# Patient Record
Sex: Female | Born: 1990 | Race: White | Hispanic: No | Marital: Single | Smoking: Current every day smoker
Health system: Southern US, Community
[De-identification: ages and names within clinical notes are randomized; demographics above are authoritative.]

---

## 2017-07-12 ENCOUNTER — Other Ambulatory Visit: Payer: Self-pay

## 2017-07-12 ENCOUNTER — Ambulatory Visit (INDEPENDENT_AMBULATORY_CARE_PROVIDER_SITE_OTHER)

## 2017-07-12 ENCOUNTER — Ambulatory Visit
Admission: EM | Admit: 2017-07-12 | Discharge: 2017-07-12 | Disposition: A | Discharge status: Home / Self Care | Attending: Family Medicine | Admitting: Family Medicine

## 2017-07-12 DIAGNOSIS — W19XXXA Unspecified fall, initial encounter: Secondary | ICD-10-CM | POA: Diagnosis not present

## 2017-07-12 DIAGNOSIS — M25531 Pain in right wrist: Secondary | ICD-10-CM

## 2017-07-12 MED ORDER — MELOXICAM 15 MG PO TABS: 15.0000 mg | ORAL_TABLET | Freq: Every day | ORAL | 0 refills | Status: AC | PRN

## 2017-07-12 NOTE — Discharge Instructions (Signed)
Xray negative.  Rest, ice, elevation.  Use the meloxicam as needed.  If persists, please follow up with your PCP.  Take care  Dr. Adriana Simasook

## 2017-07-12 NOTE — ED Provider Notes (Signed)
[facility]    CSN: [id number] Arrival date & time: 07/12/17  1826  History   Chief Complaint Chief Complaint  Patient presents with  . Fall  . Wrist Pain    Right   HPI  27 year old female presents with right wrist pain.  Patient states that earlier today she was walking her dog.  This was around 12:30 PM.  She states that she somehow slipped and fell forward on an outstretched hand.  She states that she injured her right wrist.  Since that time she has had significant pain, 7.5/10 in severity.  Pain is located on the radial side of the wrist.  She has taken Advil and has been using a brace without resolution.  No reports of swelling.  She states that the area appears red to her.  Decreased range of motion secondary to pain.  Worse with range of motion.  No relieving factors.  No other associated symptoms.  No other complaints at this time.  PMH - Tobacco abuse.  Surgical Hx - No past surgeries.  Home Medications    Prior to Admission medications   Medication Sig Start Date End Date Taking? Authorizing Provider  meloxicam (MOBIC) 15 MG tablet Take 1 tablet (15 mg total) by mouth daily as needed for pain. 07/12/17   Tommie Samsook, Glorianna Gott G, DO   Family History Family History  Problem Relation Age of Onset  . Arrhythmia Mother        has a pacemaker    Social History Social History   Tobacco Use  . Smoking status: Current Every Day Smoker    Packs/day: 0.10  . Smokeless tobacco: Never Used  Substance Use Topics  . Alcohol use: Yes    Comment: rarely  . Drug use: No    Allergies   Patient has no known allergies.  Review of Systems Review of Systems  Constitutional: Negative.   Musculoskeletal:       Right wrist pain. Redness.   Physical Exam Triage Vital Signs ED Triage Vitals  Enc Vitals Group     BP 07/12/17 1842 123/74     Pulse Rate 07/12/17 1842 86     Resp 07/12/17 1842 18     Temp 07/12/17 1842 98.6 F (37 C)     Temp Source 07/12/17 1842  Oral     SpO2 07/12/17 1842 96 %     Weight 07/12/17 1839 200 lb (90.7 kg)     Height 07/12/17 1839 5\' 6"  (1.676 m)     Head Circumference --      Peak Flow --      Pain Score 07/12/17 1839 7     Pain Loc --      Pain Edu? --      Excl. in GC? --    Updated Vital Signs BP 123/74 (BP Location: Left Arm)   Pulse 86   Temp 98.6 F (37 C) (Oral)   Resp 18   Ht 5\' 6"  (1.676 m)   Wt 200 lb (90.7 kg)   LMP 07/12/2017   SpO2 96%   BMI 32.28 kg/m     Physical Exam  Constitutional: She is oriented to person, place, and time. She appears well-developed and well-nourished. No distress.  Cardiovascular: Normal rate and regular rhythm.  No murmur heard. Pulmonary/Chest: Effort normal and breath sounds normal. She has no wheezes. She has no rales.  Musculoskeletal:  Wrist - Right: Inspection normal with no visible erythema or swelling. ROM smooth.  Pain  with flexion and extension. Palpation -tenderness of the radial styloid and anatomic snuffbox.    Neurological: She is alert and oriented to person, place, and time.  Skin: Skin is warm. No rash noted.  Psychiatric: She has a normal mood and affect. Her behavior is normal.  Nursing note and vitals reviewed.  UC Treatments / Results  Labs (all labs ordered are listed, but only abnormal results are displayed) Labs Reviewed - No data to display  EKG  EKG Interpretation None      Radiology Dg Wrist Complete Right  Result Date: 07/12/2017 CLINICAL DATA:  Right wrist pain after fall today. EXAM: RIGHT WRIST - COMPLETE 3+ VIEW COMPARISON:  None. FINDINGS: Osseous alignment is normal. No fracture line or displaced fracture fragment identified. Adjacent soft tissues are unremarkable. IMPRESSION: Negative. Electronically Signed   By: Bary Richard M.D.   On: 07/12/2017 19:22   Procedures Procedures (including critical care time)  Medications Ordered in UC Medications - No data to display  Initial Impression / Assessment and Plan  / UC Course  I have reviewed the triage vital signs and the nursing notes.  Pertinent labs & imaging results that were available during my care of the patient were reviewed by me and considered in my medical decision making (see chart for details).     27 year old female presents with a wrist injury.  X-ray negative.  Advised rest, ice, elevation.  Meloxicam as needed.  Advised reimaging if pain persists/worsens.  Final Clinical Impressions(s) / UC Diagnoses   Final diagnoses:  Right wrist pain    ED Discharge Orders        Ordered    meloxicam (MOBIC) 15 MG tablet  Daily PRN     07/12/17 1927     Controlled Substance Prescriptions [place] Controlled Substance Registry consulted? Not Applicable   Tommie Sams, DO 07/12/17 Ninfa Linden

## 2017-07-12 NOTE — ED Triage Notes (Signed)
Patient complains of right wrist pain that occurred after a fall while walking her dog today. Patient states that she tried to catch herself and landed her weight on her wrist around 12pm.

## 2019-08-03 IMAGING — CR DG WRIST COMPLETE 3+V*R*
4 series · 4 of 4 positions shown · non-contrast
Comparison: None.

CLINICAL DATA: Right wrist pain after fall today.

EXAM:
RIGHT WRIST - COMPLETE 3+ VIEW

[wrist pa]
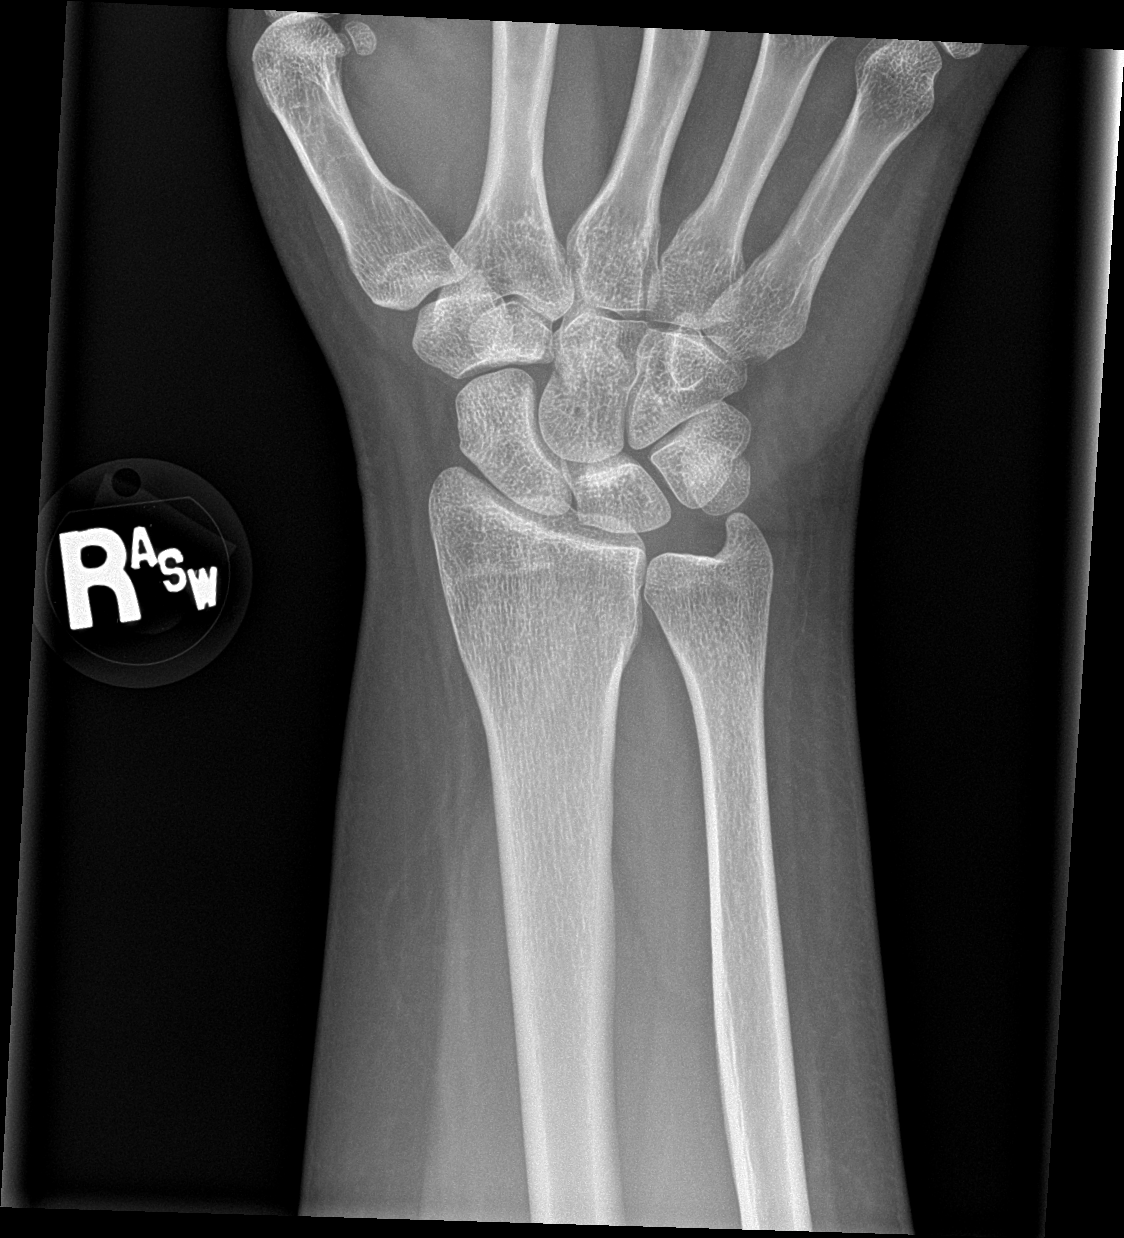

[wrist obl]
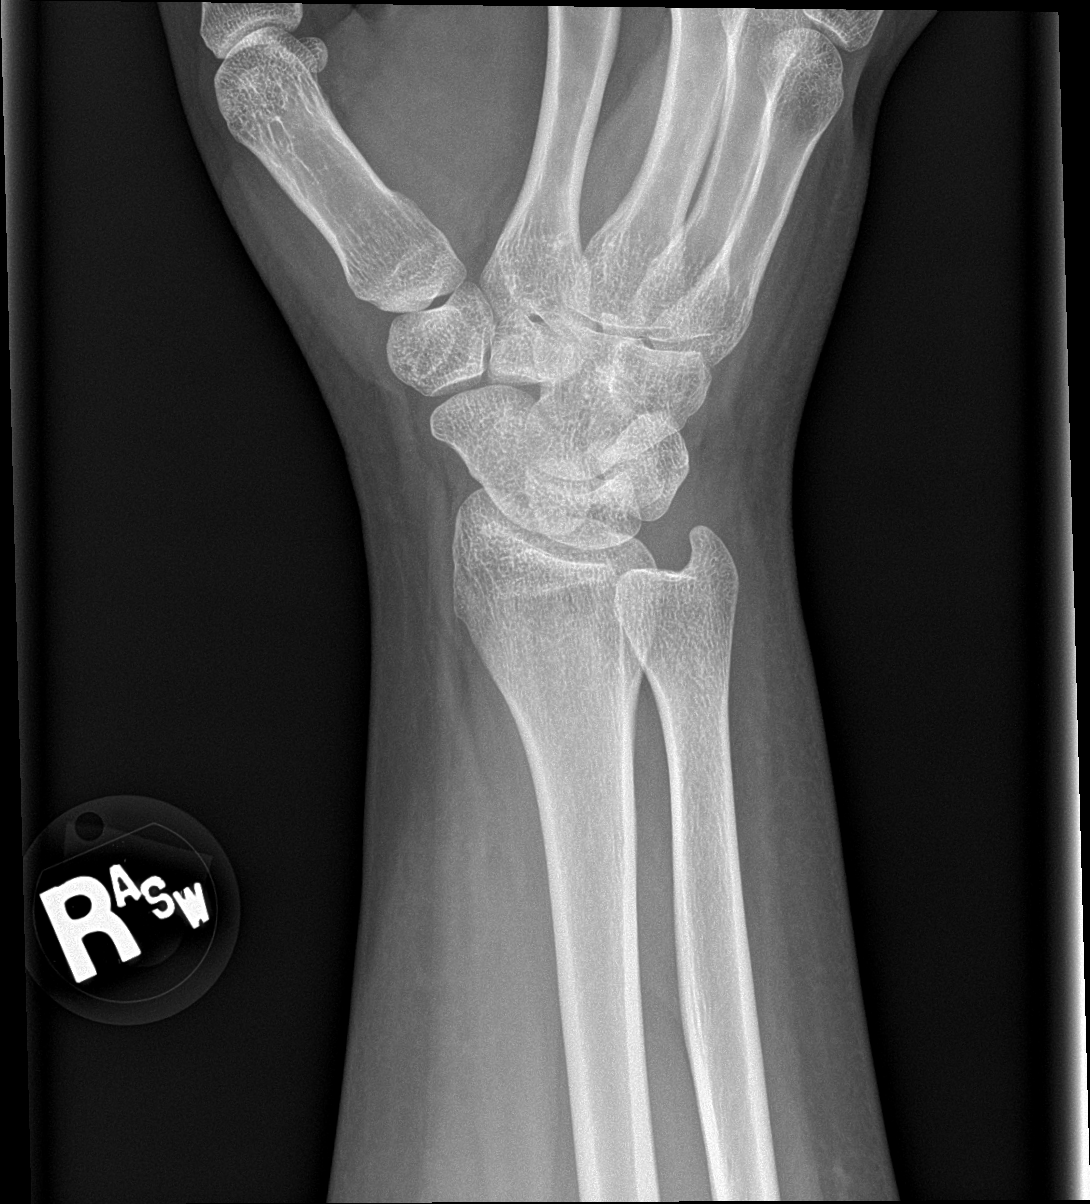

[wrist lat]
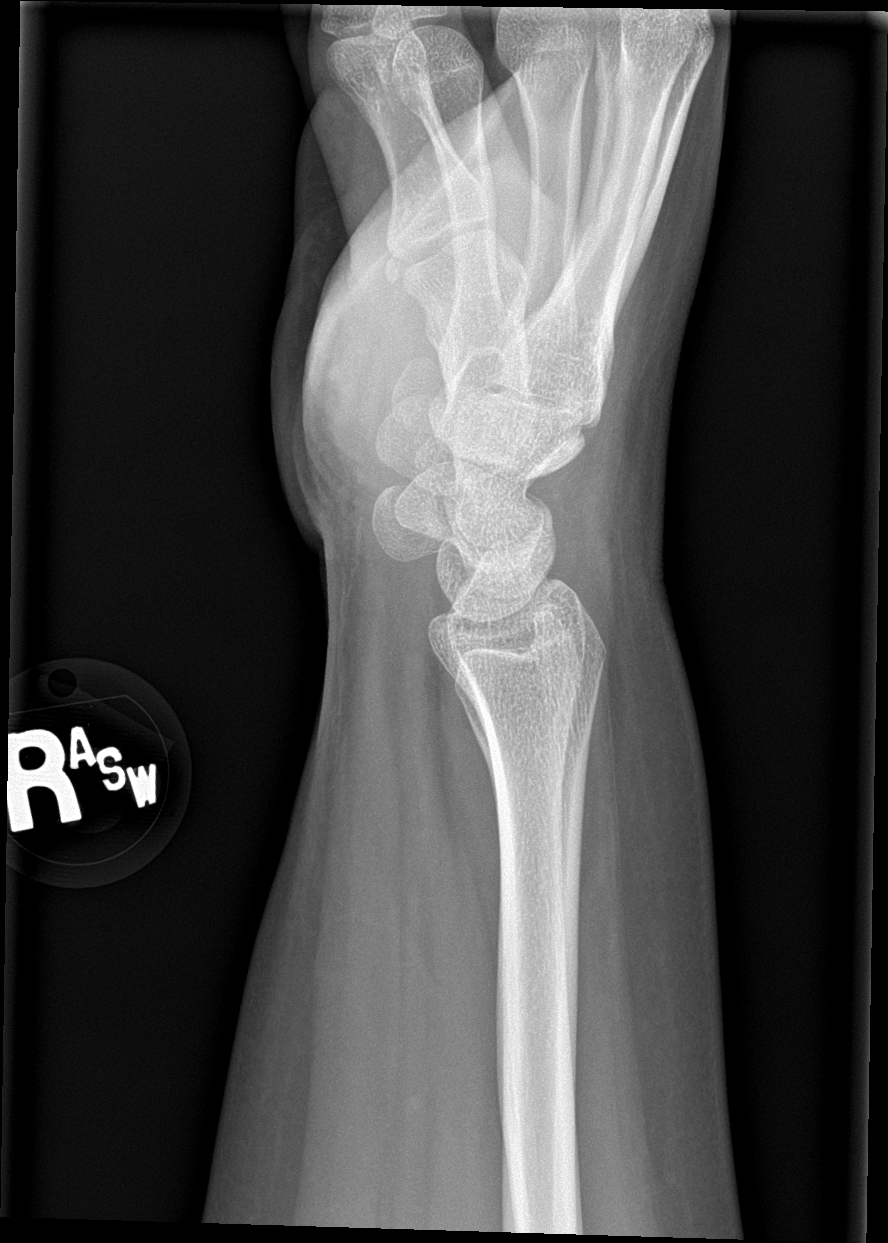

[wrist navicular]
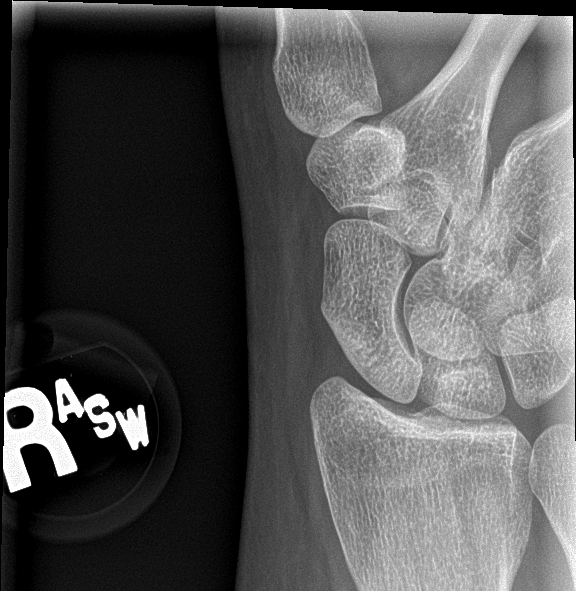

[4 of 4 positions shown; findings below may reference images not displayed]

FINDINGS: Osseous alignment is normal. No fracture line or displaced fracture
fragment identified. Adjacent soft tissues are unremarkable.
IMPRESSION: Negative.

## 2019-08-30 ENCOUNTER — Other Ambulatory Visit: Payer: Self-pay

## 2019-08-30 ENCOUNTER — Encounter: Payer: Self-pay | Admitting: Emergency Medicine

## 2019-08-30 ENCOUNTER — Emergency Department
Admission: EM | Admit: 2019-08-30 | Discharge: 2019-08-30 | Disposition: A | Discharge status: Home / Self Care | Attending: Emergency Medicine | Admitting: Emergency Medicine

## 2019-08-30 DIAGNOSIS — R112 Nausea with vomiting, unspecified: Secondary | ICD-10-CM | POA: Diagnosis not present

## 2019-08-30 DIAGNOSIS — F17210 Nicotine dependence, cigarettes, uncomplicated: Secondary | ICD-10-CM | POA: Insufficient documentation

## 2019-08-30 DIAGNOSIS — R109 Unspecified abdominal pain: Secondary | ICD-10-CM | POA: Diagnosis present

## 2019-08-30 LAB — POCT PREGNANCY, URINE: Preg Test, Ur: NEGATIVE

## 2019-08-30 MED ORDER — KETOROLAC TROMETHAMINE 30 MG/ML IJ SOLN
30.0000 mg | Freq: Once | INTRAMUSCULAR | Status: AC
  Administered 2019-08-30: 30 mg via INTRAMUSCULAR
  Filled 2019-08-30: qty 1

## 2019-08-30 MED ORDER — ONDANSETRON 4 MG PO TBDP: 4.0000 mg | ORAL_TABLET | Freq: Three times a day (TID) | ORAL | 0 refills | Status: AC | PRN

## 2019-08-30 MED ORDER — ONDANSETRON 4 MG PO TBDP
4.0000 mg | ORAL_TABLET | Freq: Once | ORAL | Status: AC
  Administered 2019-08-30: 09:00:00 4 mg via ORAL
  Filled 2019-08-30: qty 1

## 2019-08-30 NOTE — ED Provider Notes (Signed)
[facility] Emergency Department Provider Note   ____________________________________________    I have reviewed the triage vital signs and the nursing notes.   HISTORY  Chief Complaint Abdominal cramping, nausea vomiting    HPI Melissa Moreno is a 29 y.o. female who presents with complaints of recently started menstruating, having pelvic cramping and nausea which she has had with prior menstrual cycles.  After several episodes of vomiting noticed specks of blood in the last vomitus became concerned and came to the emergency department.  She reports she is feeling better here.  Did have vaginal bleeding 2 weeks ago.  She is trying to get pregnant.  No fevers chills.  No abdominal pain currently.  History reviewed. No pertinent past medical history.  There are no problems to display for this patient.   History reviewed. No pertinent surgical history.  Prior to Admission medications   Medication Sig Start Date End Date Taking? Authorizing Provider  meloxicam (MOBIC) 15 MG tablet Take 1 tablet (15 mg total) by mouth daily as needed for pain. 07/12/17   Tommie Sams, DO  ondansetron (ZOFRAN ODT) 4 MG disintegrating tablet Take 1 tablet (4 mg total) by mouth every 8 (eight) hours as needed. 08/30/19   Jene Every, MD     Allergies Patient has no known allergies.  Family History  Problem Relation Age of Onset  . Arrhythmia Mother        has a pacemaker    Social History Social History   Tobacco Use  . Smoking status: Current Every Day Smoker    Packs/day: 0.10  . Smokeless tobacco: Never Used  Substance Use Topics  . Alcohol use: Yes    Comment: rarely  . Drug use: No    Review of Systems  Constitutional: No fever/chills Eyes: No visual changes.  ENT: No sore throat. Cardiovascular: Denies chest pain. Respiratory: Denies shortness of breath. Gastrointestinal: As above Genitourinary: Negative for dysuria.  As above Musculoskeletal:  Negative for back pain. Skin: Negative for rash. Neurological: Negative for headaches    ____________________________________________   PHYSICAL EXAM:  VITAL SIGNS: ED Triage Vitals  Enc Vitals Group     BP 08/30/19 0806 119/86     Pulse Rate 08/30/19 0806 81     Resp 08/30/19 0806 16     Temp 08/30/19 0806 98.4 F (36.9 C)     Temp Source 08/30/19 0806 Oral     SpO2 08/30/19 0806 100 %     Weight 08/30/19 0808 95.3 kg (210 lb)     Height 08/30/19 0808 1.676 m (5\' 6" )     Head Circumference --      Peak Flow --      Pain Score 08/30/19 0808 5     Pain Loc --      Pain Edu? --      Excl. in GC? --     Constitutional: Alert and oriented.   Nose: No congestion/rhinnorhea. Mouth/Throat: Mucous membranes are moist.    Cardiovascular: Normal rate, regular rhythm. 09/01/19 peripheral circulation. Respiratory: Normal respiratory effort.  No retractions.  Gastrointestinal: Soft and nontender. No distention.  No CVA tenderness.  Musculoskeletal:  Warm and well perfused Neurologic:  Normal speech and language. No gross focal neurologic deficits are appreciated.  Skin:  Skin is warm, dry and intact. No rash noted. Psychiatric: Mood and affect are normal. Speech and behavior are normal.  ____________________________________________   LABS (all labs ordered are listed, but only  abnormal results are displayed)  Labs Reviewed  POCT PREGNANCY, URINE   ____________________________________________  EKG  ________________________________________  RADIOLOGY  None ____________________________________________   PROCEDURES  Procedure(s) performed: No  Procedures   Critical Care performed: No ____________________________________________   INITIAL IMPRESSION / ASSESSMENT AND PLAN / ED COURSE  Pertinent labs & imaging results that were available during my care of the patient were reviewed by me and considered in my medical decision making (see chart for details).   Patient well-appearing in no acute distress, she reports she is feeling improved now, no coffee-ground emesis or significant hematemesis.  Treated with p.o. ODT Zofran, Toradol after negative pregnancy test.  She is feeling improved, recommend symptomatic treatment, appropriate for discharge at this time with outpatient follow-up as needed.    ____________________________________________   FINAL CLINICAL IMPRESSION(S) / ED DIAGNOSES  Final diagnoses:  Non-intractable vomiting with nausea, unspecified vomiting type        Note:  This document was prepared using Dragon voice recognition software and may include unintentional dictation errors.   Lavonia Drafts, MD 08/30/19 [phone]

## 2019-08-30 NOTE — ED Triage Notes (Signed)
Pt to ED via POV c/o abdominal pain and vomiting blood. Pt states that she started her menstrual cycle yesterday and the cramps were "unusual" yesterday and today. Pt states that she started vomiting and noted blood in her vomit the last 2 times. Pt ambulatory into ED in NAD.

## 2024-03-06 ENCOUNTER — Ambulatory Visit: Attending: Family Medicine

## 2024-03-06 ENCOUNTER — Other Ambulatory Visit: Payer: Self-pay

## 2024-03-06 DIAGNOSIS — N3941 Urge incontinence: Secondary | ICD-10-CM | POA: Insufficient documentation

## 2024-03-06 DIAGNOSIS — M6281 Muscle weakness (generalized): Secondary | ICD-10-CM | POA: Insufficient documentation

## 2024-03-06 DIAGNOSIS — R2689 Other abnormalities of gait and mobility: Secondary | ICD-10-CM | POA: Insufficient documentation

## 2024-03-06 DIAGNOSIS — G8929 Other chronic pain: Secondary | ICD-10-CM | POA: Diagnosis present

## 2024-03-06 DIAGNOSIS — N393 Stress incontinence (female) (male): Secondary | ICD-10-CM | POA: Insufficient documentation

## 2024-03-06 DIAGNOSIS — M545 Low back pain, unspecified: Secondary | ICD-10-CM | POA: Diagnosis present

## 2024-03-06 DIAGNOSIS — R293 Abnormal posture: Secondary | ICD-10-CM | POA: Insufficient documentation
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
## 2024-03-06 NOTE — Patient Instructions (Signed)

## 2024-03-11 ENCOUNTER — Encounter

## 2024-03-13 ENCOUNTER — Ambulatory Visit

## 2024-03-13 ENCOUNTER — Other Ambulatory Visit: Payer: Self-pay

## 2024-03-13 DIAGNOSIS — N393 Stress incontinence (female) (male): Secondary | ICD-10-CM | POA: Diagnosis not present

## 2024-03-13 DIAGNOSIS — N3941 Urge incontinence: Secondary | ICD-10-CM

## 2024-03-13 DIAGNOSIS — R293 Abnormal posture: Secondary | ICD-10-CM

## 2024-03-13 DIAGNOSIS — R2689 Other abnormalities of gait and mobility: Secondary | ICD-10-CM

## 2024-03-13 DIAGNOSIS — G8929 Other chronic pain: Secondary | ICD-10-CM

## 2024-03-13 DIAGNOSIS — M6281 Muscle weakness (generalized): Secondary | ICD-10-CM
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
## 2024-03-18 ENCOUNTER — Ambulatory Visit

## 2024-03-25 ENCOUNTER — Ambulatory Visit

## 2024-03-27 ENCOUNTER — Other Ambulatory Visit: Payer: Self-pay

## 2024-03-27 ENCOUNTER — Ambulatory Visit

## 2024-03-27 DIAGNOSIS — G8929 Other chronic pain: Secondary | ICD-10-CM

## 2024-03-27 DIAGNOSIS — R293 Abnormal posture: Secondary | ICD-10-CM

## 2024-03-27 DIAGNOSIS — R2689 Other abnormalities of gait and mobility: Secondary | ICD-10-CM

## 2024-03-27 DIAGNOSIS — N3941 Urge incontinence: Secondary | ICD-10-CM

## 2024-03-27 DIAGNOSIS — N393 Stress incontinence (female) (male): Secondary | ICD-10-CM | POA: Diagnosis not present

## 2024-03-27 DIAGNOSIS — M6281 Muscle weakness (generalized): Secondary | ICD-10-CM
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
## 2024-04-01 ENCOUNTER — Ambulatory Visit

## 2024-04-03 ENCOUNTER — Other Ambulatory Visit: Payer: Self-pay

## 2024-04-03 ENCOUNTER — Ambulatory Visit: Attending: Family Medicine

## 2024-04-03 DIAGNOSIS — N393 Stress incontinence (female) (male): Secondary | ICD-10-CM | POA: Diagnosis present

## 2024-04-03 DIAGNOSIS — M545 Low back pain, unspecified: Secondary | ICD-10-CM | POA: Insufficient documentation

## 2024-04-03 DIAGNOSIS — R293 Abnormal posture: Secondary | ICD-10-CM | POA: Diagnosis present

## 2024-04-03 DIAGNOSIS — M6281 Muscle weakness (generalized): Secondary | ICD-10-CM | POA: Diagnosis present

## 2024-04-03 DIAGNOSIS — N3941 Urge incontinence: Secondary | ICD-10-CM | POA: Insufficient documentation

## 2024-04-03 DIAGNOSIS — R2689 Other abnormalities of gait and mobility: Secondary | ICD-10-CM | POA: Diagnosis present

## 2024-04-03 DIAGNOSIS — G8929 Other chronic pain: Secondary | ICD-10-CM | POA: Insufficient documentation
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
## 2024-04-08 ENCOUNTER — Ambulatory Visit

## 2024-04-08 ENCOUNTER — Other Ambulatory Visit: Payer: Self-pay

## 2024-04-08 DIAGNOSIS — G8929 Other chronic pain: Secondary | ICD-10-CM

## 2024-04-08 DIAGNOSIS — M6281 Muscle weakness (generalized): Secondary | ICD-10-CM

## 2024-04-08 DIAGNOSIS — R293 Abnormal posture: Secondary | ICD-10-CM

## 2024-04-08 DIAGNOSIS — N393 Stress incontinence (female) (male): Secondary | ICD-10-CM

## 2024-04-08 DIAGNOSIS — R2689 Other abnormalities of gait and mobility: Secondary | ICD-10-CM

## 2024-04-08 DIAGNOSIS — N3941 Urge incontinence: Secondary | ICD-10-CM
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
## 2024-04-15 ENCOUNTER — Ambulatory Visit

## 2024-04-22 ENCOUNTER — Other Ambulatory Visit: Payer: Self-pay

## 2024-04-22 ENCOUNTER — Ambulatory Visit

## 2024-04-22 DIAGNOSIS — R2689 Other abnormalities of gait and mobility: Secondary | ICD-10-CM

## 2024-04-22 DIAGNOSIS — M545 Low back pain, unspecified: Secondary | ICD-10-CM

## 2024-04-22 DIAGNOSIS — M6281 Muscle weakness (generalized): Secondary | ICD-10-CM

## 2024-04-22 DIAGNOSIS — N393 Stress incontinence (female) (male): Secondary | ICD-10-CM | POA: Diagnosis not present

## 2024-04-22 DIAGNOSIS — N3941 Urge incontinence: Secondary | ICD-10-CM
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
## 2024-04-28 ENCOUNTER — Ambulatory Visit

## 2024-04-29 ENCOUNTER — Ambulatory Visit

## 2024-05-01 ENCOUNTER — Ambulatory Visit

## 2024-05-06 ENCOUNTER — Ambulatory Visit

## 2024-05-13 ENCOUNTER — Ambulatory Visit

## 2024-05-19 ENCOUNTER — Ambulatory Visit: Attending: Family Medicine

## 2024-05-19 ENCOUNTER — Other Ambulatory Visit: Payer: Self-pay

## 2024-05-19 DIAGNOSIS — N393 Stress incontinence (female) (male): Secondary | ICD-10-CM | POA: Insufficient documentation

## 2024-05-19 DIAGNOSIS — R293 Abnormal posture: Secondary | ICD-10-CM | POA: Insufficient documentation

## 2024-05-19 DIAGNOSIS — G8929 Other chronic pain: Secondary | ICD-10-CM | POA: Insufficient documentation

## 2024-05-19 DIAGNOSIS — M6281 Muscle weakness (generalized): Secondary | ICD-10-CM | POA: Diagnosis present

## 2024-05-19 DIAGNOSIS — N3941 Urge incontinence: Secondary | ICD-10-CM | POA: Diagnosis present

## 2024-05-19 DIAGNOSIS — R2689 Other abnormalities of gait and mobility: Secondary | ICD-10-CM | POA: Diagnosis present

## 2024-05-19 DIAGNOSIS — M545 Low back pain, unspecified: Secondary | ICD-10-CM | POA: Diagnosis present
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
## 2024-05-20 ENCOUNTER — Ambulatory Visit

## 2024-05-26 ENCOUNTER — Ambulatory Visit

## 2024-05-27 ENCOUNTER — Ambulatory Visit

## 2024-06-23 ENCOUNTER — Ambulatory Visit
